# Patient Record
Sex: Female | Born: 1995 | Hispanic: Yes | Marital: Single
Health system: Southern US, Community
[De-identification: ages and names within clinical notes are randomized; demographics above are authoritative.]

---

## 2021-05-17 ENCOUNTER — Encounter (HOSPITAL_COMMUNITY): Payer: Self-pay

## 2021-05-17 ENCOUNTER — Emergency Department (HOSPITAL_COMMUNITY)
Admission: EM | Admit: 2021-05-17 | Discharge: 2021-05-17 | Disposition: A | Discharge status: Home / Self Care | Payer: Self-pay | Attending: Emergency Medicine | Admitting: Emergency Medicine

## 2021-05-17 ENCOUNTER — Emergency Department (HOSPITAL_COMMUNITY): Payer: Self-pay

## 2021-05-17 ENCOUNTER — Other Ambulatory Visit: Payer: Self-pay

## 2021-05-17 DIAGNOSIS — S022XXA Fracture of nasal bones, initial encounter for closed fracture: Secondary | ICD-10-CM

## 2021-05-17 DIAGNOSIS — S62612A Displaced fracture of proximal phalanx of right middle finger, initial encounter for closed fracture: Secondary | ICD-10-CM | POA: Insufficient documentation

## 2021-05-17 DIAGNOSIS — Z23 Encounter for immunization: Secondary | ICD-10-CM | POA: Insufficient documentation

## 2021-05-17 DIAGNOSIS — S0990XA Unspecified injury of head, initial encounter: Secondary | ICD-10-CM | POA: Insufficient documentation

## 2021-05-17 MED ORDER — DICLOFENAC SODIUM ER 100 MG PO TB24: 100.0000 mg | ORAL_TABLET | Freq: Every day | ORAL | 0 refills | Status: AC

## 2021-05-17 MED ORDER — DICLOFENAC SODIUM ER 100 MG PO TB24: 100.0000 mg | ORAL_TABLET | Freq: Every day | ORAL | 0 refills | Status: DC

## 2021-05-17 MED ORDER — KETOROLAC TROMETHAMINE 60 MG/2ML IM SOLN
60.0000 mg | Freq: Once | INTRAMUSCULAR | Status: AC
  Administered 2021-05-17: 60 mg via INTRAMUSCULAR
  Filled 2021-05-17: qty 2

## 2021-05-17 MED ORDER — TETRACAINE HCL 0.5 % OP SOLN
2.0000 [drp] | Freq: Once | OPHTHALMIC | Status: AC
  Administered 2021-05-17: 2 [drp] via OPHTHALMIC
  Filled 2021-05-17: qty 4

## 2021-05-17 MED ORDER — TRAMADOL HCL 50 MG PO TABS: 50.0000 mg | ORAL_TABLET | Freq: Four times a day (QID) | ORAL | 0 refills | Status: AC | PRN

## 2021-05-17 MED ORDER — FLUORESCEIN SODIUM 1 MG OP STRP
1.0000 | ORAL_STRIP | Freq: Once | OPHTHALMIC | Status: AC
  Administered 2021-05-17: 1 via OPHTHALMIC
  Filled 2021-05-17: qty 1

## 2021-05-17 MED ORDER — TETANUS-DIPHTH-ACELL PERTUSSIS 5-2.5-18.5 LF-MCG/0.5 IM SUSY
0.5000 mL | PREFILLED_SYRINGE | Freq: Once | INTRAMUSCULAR | Status: AC
  Administered 2021-05-17: 0.5 mL via INTRAMUSCULAR
  Filled 2021-05-17: qty 0.5

## 2021-05-17 MED ORDER — ACETAMINOPHEN 500 MG PO TABS
1000.0000 mg | ORAL_TABLET | Freq: Once | ORAL | Status: AC
  Administered 2021-05-17: 1000 mg via ORAL
  Filled 2021-05-17: qty 2

## 2021-05-17 NOTE — ED Notes (Signed)
Visual acuity test performed. 20/40 vision in both eyes.
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
